# Patient Record
Sex: Female | Born: 1945 | Race: White | Hispanic: No | Marital: Married | Smoking: Never smoker
Health system: Southern US, Community
[De-identification: ages and names within clinical notes are randomized; demographics above are authoritative.]

## PROBLEM LIST (undated history)

## (undated) DIAGNOSIS — E119 Type 2 diabetes mellitus without complications: Secondary | ICD-10-CM

## (undated) DIAGNOSIS — M19041 Primary osteoarthritis, right hand: Secondary | ICD-10-CM

## (undated) DIAGNOSIS — G20A1 Parkinson's disease without dyskinesia, without mention of fluctuations: Secondary | ICD-10-CM

## (undated) DIAGNOSIS — M19042 Primary osteoarthritis, left hand: Secondary | ICD-10-CM

## (undated) DIAGNOSIS — G20 Parkinson's disease: Secondary | ICD-10-CM

## (undated) DIAGNOSIS — R202 Paresthesia of skin: Secondary | ICD-10-CM

## (undated) DIAGNOSIS — K219 Gastro-esophageal reflux disease without esophagitis: Secondary | ICD-10-CM

## (undated) DIAGNOSIS — M26629 Arthralgia of temporomandibular joint, unspecified side: Secondary | ICD-10-CM

## (undated) DIAGNOSIS — E782 Mixed hyperlipidemia: Secondary | ICD-10-CM

## (undated) HISTORY — DX: Type 2 diabetes mellitus without complications: E11.9

## (undated) HISTORY — DX: Gastro-esophageal reflux disease without esophagitis: K21.9

## (undated) HISTORY — DX: Arthralgia of temporomandibular joint, unspecified side: M26.629

## (undated) HISTORY — DX: Primary osteoarthritis, left hand: M19.042

## (undated) HISTORY — DX: Parkinson's disease: G20

## (undated) HISTORY — DX: Parkinson's disease without dyskinesia, without mention of fluctuations: G20.A1

## (undated) HISTORY — DX: Primary osteoarthritis, right hand: M19.041

## (undated) HISTORY — DX: Mixed hyperlipidemia: E78.2

## (undated) HISTORY — DX: Paresthesia of skin: R20.2

---

## 2019-10-05 ENCOUNTER — Encounter: Payer: Self-pay | Admitting: Neurology

## 2019-10-09 NOTE — Progress Notes (Signed)
Assessment/Plan:   1.  Parkinson's disease, by history  -Patient has apparently had this diagnosis for 15 years now, without change in medication dosage.  When she was last seen by Dr. Johnell Comings, he questioned the diagnosis.  I also am questioning the patient's diagnosis although she is a little bradykinetic.  Did discuss with the patient that her symptoms certainly could be hid by the fact that she is on medication, but generally if someone has had Parkinson's for this long, we usually can see symptoms of it despite being on medication.  We discussed DaTscan.  We also discussed that I think it would be wise for her to come back and see me off of medication for 48 hours to see if we have an accurate diagnosis.  This is especially true given that she is on a dopamine agonist at a fairly high dose (max dose) in somebody who is 74 years old.  This medication often times is not tolerated at this age group, or will not be as she ages.  She and I discussed this in detail.  Ultimately, she wants to think about her options.  We will call her back in 2 weeks and see how she would like to proceed.  She had many questions and I answered those to the best of my ability.  She obviously is fairly concerned about questioning the diagnosis, given that she has had it for a long time, but also recognizes that she has not deteriorated in the 15 years that she has had the diagnosis.  Subjective:   Nicole Castillo was seen today in the movement disorders clinic for neurologic consultation at the request of Donata Duff, MD.  The consultation is for the evaluation of Parkinson's disease.  Patient previously has seen multiple neurologists.  She last saw Dr. Johnell Comings in February, 2018.  She reports that she has not been seen by any neurologist since.  I have reviewed all records made available to me.   Patient brought me records from her early years.  Records indicate that patient was diagnosed in 2007 with essential tremor by  Dr. Logan Bores.  She was on multiple medications including propranolol, primidone, gabapentin, topiramate, amantadine, clonazepam, trihexyphenidyl, Cogentin, mirtazapine.  She then saw Dr. Chandra Batch, who felt that the patient did have Parkinson's disease.  He diagnosed her with that in 2009.   She was placed on levodopa in that year.  She brings those records for my review.  He indicates that she tried the medication and initially reported no help with the tremor and so then he started her on Requip XL, 2 mg daily, which also did not seem to help tremor.  This was discontinued and she subsequently tried Stalevo, 75 mg.  She later on want to try Mirapex, 4.5 mg daily.  She then changed care to Dr. Adella Hare.  At that point, she was on carbidopa/levodopa 50/200, 3 times per day (6:30am/noon/6pm) and pramipexole 1.5 mg 3 times per day.  She is still on those same medications at the same dosages.     In 2018, when she saw Dr. Johnell Comings, he did question the accuracy of the diagnosis.  He wrote "I do not see PD, but patient is on meds.  However, has had for 10 years and would expect to see something.  Offered to her to go off meds for couple of days and see how she does."  Patient was apparently to consider that option.  She did not follow-up after  that.  Patient reports that she did not go back because Dr. Johnell Comings changed offices and it was just too far to drive.   Specific Symptoms:  Tremor: Yes.   but only when upset now.  She isn't sure about character of tremor in the past.  If gets tremor if gets upset, she will get bilateral UE with intention only (no rest) Family hx of similar:  No. Voice: no change Sleep:   Vivid Dreams:  No.  Acting out dreams:  No. Wet Pillows: No. Postural symptoms:  Yes.  , when first stands especially  Falls?  No. Bradykinesia symptoms: intermittent shuffling Loss of smell:  No. Loss of taste:  No. Urinary Incontinence:  No. Difficulty Swallowing:  No. Handwriting,  micrographia: No. Trouble with ADL's:  No.  Trouble buttoning clothing: No. Depression:  No. Memory changes:  No. Hallucinations:  No. (perhaps rare)  visual distortions: Yes.   N/V:  No. Lightheaded:  No.  Syncope: No. Diplopia:  No. Dyskinesia:  No. Prior exposure to reglan/antipsychotics: No.  PREVIOUS MEDICATIONS:  propranolol, primidone, gabapentin, topiramate, amantadine, clonazepam, trihexyphenidyl, Cogentin, mirtazapine; levodopa; Requip; Mirapex  ALLERGIES:  No Known Allergies  CURRENT MEDICATIONS:  Current Outpatient Medications  Medication Instructions  . carbidopa-levodopa (SINEMET CR) 50-200 MG tablet 1 tablet, Oral, 3 times daily  . Cholecalciferol (VITAMIN D3 PO) 1,000 mg, Oral, Daily  . glucose blood (ACCU-CHEK AVIVA PLUS) test strip 1 each, Other, As needed, Use as instructed   . Lancets MISC As needed  . metFORMIN (GLUCOPHAGE-XR) 500 mg, Oral, 2 times daily  . Multiple Vitamin (MULTIVITAMIN WITH MINERALS) TABS tablet 1 tablet, Oral, Daily  . omeprazole (PRILOSEC) 20 mg, Oral, Daily  . pramipexole (MIRAPEX) 1.5 mg, Oral, 3 times daily  . rosuvastatin (CRESTOR) 10 mg, Oral, Daily    Objective:   VITALS:   Vitals:   10/23/19 1331  BP: (!) 152/72  Pulse: 85  SpO2: 98%  Weight: 117 lb (53.1 kg)  Height: 5\' 3"  (1.6 m)    GEN:  The patient appears stated age and is in NAD. HEENT:  Normocephalic, atraumatic.  The mucous membranes are moist. The superficial temporal arteries are without ropiness or tenderness. CV:  RRR Lungs:  CTAB Neck/HEME:  There are no carotid bruits bilaterally.  Neurological examination:  Orientation: The patient is alert and oriented x3.  Cranial nerves: There is good facial symmetry. There is facial hypomimia.  Extraocular muscles are intact. The visual fields are full to confrontational testing. The speech is fluent and clear. Soft palate rises symmetrically and there is no tongue deviation. Hearing is intact to conversational  tone. Sensation: Sensation is intact to light and pinprick throughout (facial, trunk, extremities). Vibration is intact at the bilateral big toe. There is no extinction with double simultaneous stimulation. There is no sensory dermatomal level identified. Motor: Strength is 5/5 in the bilateral upper and lower extremities.   Shoulder shrug is equal and symmetric.  There is no pronator drift. Deep tendon reflexes: Deep tendon reflexes are 2/4 at the bilateral biceps, triceps, brachioradialis, patella and achilles. Plantar responses are downgoing bilaterally.  Movement examination: Tone: There is normal tone in the bilateral upper extremities.  The tone in the lower extremities is normal.  Abnormal movements: none even with distraction Coordination:  There is  decremation with RAM's, only with alternation of supination/pronation of the forearm on the L and toe taps on the L Gait and Station: The patient ambulates well in the hall.  Total time spent  on today's visit was 70 minutes, including both face-to-face time and nonface-to-face time.  Time included that spent on review of records (prior notes available to me/labs/imaging if pertinent), discussing treatment and goals, answering patient's questions and coordinating care.  Cc:  Donata Duff, MD

## 2019-10-16 ENCOUNTER — Encounter: Payer: Self-pay | Admitting: Neurology

## 2019-10-23 ENCOUNTER — Ambulatory Visit (INDEPENDENT_AMBULATORY_CARE_PROVIDER_SITE_OTHER): Payer: MEDICARE | Admitting: Neurology

## 2019-10-23 ENCOUNTER — Other Ambulatory Visit: Payer: Self-pay

## 2019-10-23 ENCOUNTER — Encounter: Payer: Self-pay | Admitting: Neurology

## 2019-10-23 VITALS — BP 152/72 | HR 85 | Ht 63.0 in | Wt 117.0 lb

## 2019-10-23 DIAGNOSIS — G20 Parkinson's disease: Secondary | ICD-10-CM | POA: Diagnosis not present

## 2019-10-23 NOTE — Patient Instructions (Signed)
It was good to see you today.  We discussed that either your medications are doing a good job in covering up the symptoms of your Parkinsons Disease or it is a possibility that you don't have Parkinsons Disease.  I would like to do a DaT scan.  Please review the literature on the scan and we will call you in a few weeks to discuss further if you want to proceed.  The physicians and staff at [facility] are committed to providing excellent care. You may receive a survey requesting feedback about your experience at our office. We strive to receive "very good" responses to the survey questions. If you feel that your experience would prevent you from giving the office a "very good " response, please contact our office to try to remedy the situation. We may be reached at [phone]. Thank you for taking the time out of your busy day to complete the survey.

## 2019-10-26 ENCOUNTER — Telehealth: Payer: Self-pay

## 2019-10-26 NOTE — Telephone Encounter (Signed)
Spoke with patients daughter and she wanted to know know what kind of test Dr Tat wanted the patient to do.   Explained to daughter that Dr Tat wants her mom to consider having the DaTscan. She wanted to know if the patient would be able to have one visitor go back with her and I told her I thought so but ask the scheduler when they call to schedule appt. She voiced understanding and thanked me for returning her call.

## 2019-11-09 ENCOUNTER — Telehealth: Payer: Self-pay | Admitting: Neurology

## 2019-11-09 NOTE — Telephone Encounter (Signed)
Spoke with Dr Tat and she stated the patient will be given a medication to protect her thyroid. However all instructions will be given right before the test so the patient will know what to expect.   Patient notified and voiced understanding.   Patient wanted to know what times they do the testing. I advised her that once the scheduler contacts her she will be able to answer her scheduling questions.

## 2019-11-09 NOTE — Telephone Encounter (Signed)
Patient contacted the office and stated she has two questions about the DaTscan.   1) wants to know what kind of medication she will be given before her   Advised patient that I would speak with Dr Tat and get back to her. She voiced understanding.

## 2019-11-09 NOTE — Telephone Encounter (Signed)
Left message for patient to contact office.   Called and spoke with daughter Clydie Braun, per [facility] and she states the patient is considering the scan and is waiting for someone from our office to contact her. I advised Clydie Braun that I contacted the patient and left her a message. She states the patients other daughter is at the beach and will return Monday, and for me to expect a callback then.

## 2019-11-20 ENCOUNTER — Telehealth: Payer: Self-pay | Admitting: Neurology

## 2019-11-20 DIAGNOSIS — R251 Tremor, unspecified: Secondary | ICD-10-CM

## 2019-11-20 NOTE — Telephone Encounter (Signed)
Patient called to check on the status of the Dat scan. She still hasn't heard from anyone about scheduling.

## 2019-12-10 ENCOUNTER — Telehealth: Payer: Self-pay

## 2019-12-10 NOTE — Telephone Encounter (Signed)
Patient contacted the office today requesting that Ladona Ridgel from [facility] give her a call back today before noon in regards to her DaTscan.   Message sent to [facility] to please contact patient.   Ladona Ridgel voiced understanding.

## 2019-12-13 ENCOUNTER — Ambulatory Visit (HOSPITAL_COMMUNITY): Payer: Medicare Other

## 2020-01-10 ENCOUNTER — Ambulatory Visit (HOSPITAL_COMMUNITY)
Admission: RE | Admit: 2020-01-10 | Discharge: 2020-01-10 | Disposition: A | Discharge status: Home / Self Care | Payer: MEDICARE | Source: Ambulatory Visit | Attending: Neurology | Admitting: Neurology

## 2020-01-10 ENCOUNTER — Other Ambulatory Visit: Payer: Self-pay

## 2020-01-10 DIAGNOSIS — R251 Tremor, unspecified: Secondary | ICD-10-CM | POA: Insufficient documentation

## 2020-01-10 MED ORDER — IOFLUPANE I 123 185 MBQ/2.5ML IV SOLN
4.9000 | Freq: Once | INTRAVENOUS | Status: AC
  Administered 2020-01-10: 4.9 via INTRAVENOUS

## 2020-01-10 MED ORDER — POTASSIUM IODIDE (ANTIDOTE) 130 MG PO TABS: 130.0000 mg | ORAL_TABLET | Freq: Once | ORAL | Status: AC

## 2020-01-10 MED ORDER — POTASSIUM IODIDE (ANTIDOTE) 130 MG PO TABS
ORAL_TABLET | ORAL | Status: AC
  Administered 2020-01-10: 130 mg via ORAL
  Filled 2020-01-10: qty 1

## 2020-01-11 ENCOUNTER — Telehealth: Payer: Self-pay | Admitting: Neurology

## 2020-01-11 NOTE — Telephone Encounter (Signed)
Left message for patient to contact the office.

## 2020-01-11 NOTE — Telephone Encounter (Signed)
1.  Let pt know that her DaT scan was equivocal (meaning didn't give any definitive answers) 2.  No changes in med for now 3.  Have her make regular follow up appt (not urgent) 4.  When she follows up, I want her to hold her Parkinsons Disease meds for 24 hours (both the pramipexole And levodopa) prior to the appt

## 2020-01-11 NOTE — Telephone Encounter (Signed)
Spoke with patient and gave her Dr Don Perking recommendations. Informed her that one of the schedulers will give her a call on Monday to schedule an appt with Dr Tat.  She voiced understanding.   Patient requested a call Monday afternoon.   Message sent to schedulers.

## 2020-01-11 NOTE — Telephone Encounter (Signed)
Patient returned call to Tee. 

## 2020-01-11 NOTE — Telephone Encounter (Signed)
Patient left message that she was returning a phone call to the office regarding some results. Requesting call back.

## 2020-02-25 ENCOUNTER — Telehealth: Payer: Self-pay | Admitting: Neurology

## 2020-02-25 NOTE — Telephone Encounter (Signed)
Patient notified and voiced understanding.

## 2020-02-25 NOTE — Progress Notes (Signed)
Assessment/Plan:   1.  Parkinsons Disease  -Patient seen off of medication today and looked significantly more parkinsonian.  -We discussed her medication.  Discussed with her that I generally do not use carbidopa/levodopa 50/200 during the daytime.  However, she really has been happy with efficacy and would like to stay on that medication.  I did not change that today.  She will remain on carbidopa/levodopa 50/200, 1 tablet 3 times per day.  -Long discussion regarding the patient's pramipexole.  Discussed my concern with this medication, especially that she is on the max dose of the medicine and is having hallucinations and delusions.  Discussed with her that I really think we should decrease the dose of the medication.  She was leery, but agreeable.  I am going to slowly decrease the dosage from 1.5 mg 3 times per day to 1.0 mg 3 times per day.  This is still quite a large dosage in this age group.  Nonetheless, the patient is worried about even making this change.  We will see how she does clinically.  -I think that the patient needs physical therapy.  She declined that.  She will let me know if she changes her mind.  I encouraged her to get on her recumbent bike at home.  2.  Patient asked if she needed to follow-up here at all.  I told her I really think she needs to be followed by a neurologist, whether here or elsewhere.  She states that this is the closest place, and it is an hour away from her home.  Offered her video visits, but she has no capability to do so.  In that case, I think it is important that she at least follows here twice per year.  She was agreeable to that, as was her daughter. Subjective:   Nicole Castillo was seen today in follow up for Parkinsons disease.  My previous records were reviewed prior to todays visit as well as outside records available to me.  pts daughter present who supplements the history.   The patient had long been followed by another neurologist, last by  Dr. Johnell Comings, who questioned the diagnosis of Parkinson's disease, as she had the diagnosis for 15 years without significant progression.  When I saw her, I also question the diagnosis, although she was a little bit bradykinetic.  However, I recognize that I did see her on medication, which could have changed how she looked.  I did recommend that she come back and let me see her off medication, and called her a few days ago and asked her to hold her medication for today's visit (she has been off of it for over 24 hours).  She does state that she has not felt good since being off of the meds.  States that her legs/feet feel like lead and "my heart feels different."  Her daughter states that she is having some hallucinations - kids in the living room (pt denies that) but admits to illusions (leaf looking like a bug).  No falls since last visit.  Not doing any exercise.  More trouble getting in and out of the car (even prior to going off of the med)   Ultimately, she decided to go ahead and proceed with DaTscan. Patient had DaTscan done since last visit.  There was motion degrading of the imaging.  There was symmetric decreased uptake of the radiotracer bilaterally, considered equivocal for parkinsonian syndromes.  Pt denies falls.  Pt denies lightheadedness, near  syncope.  No hallucinations.  Mood has been good.  Current prescribed movement disorder medications: Carbidopa/levodopa 50/200, 1 tablet at 6:30 AM/noon/6 PM  Pramipexole 1.5 mg 3 times per day   PREVIOUS MEDICATIONS:  propranolol, primidone, gabapentin, topiramate, amantadine, clonazepam, trihexyphenidyl, Cogentin, mirtazapine; levodopa; Requip; Mirapex  ALLERGIES:  No Known Allergies  CURRENT MEDICATIONS:  Outpatient Encounter Medications as of 02/27/2020  Medication Sig  . carbidopa-levodopa (SINEMET CR) 50-200 MG tablet Take 1 tablet by mouth in the morning, at noon, and at bedtime.  . Cholecalciferol (VITAMIN D3 PO) Take 1,000 mg by mouth  daily.  Marland Kitchen glucose blood (ACCU-CHEK AVIVA PLUS) test strip 1 each by Other route as needed for other. Use as instructed  . Lancets MISC as needed.  . metFORMIN (GLUCOPHAGE-XR) 500 MG 24 hr tablet Take 500 mg by mouth in the morning and at bedtime.  . Multiple Vitamin (MULTIVITAMIN WITH MINERALS) TABS tablet Take 1 tablet by mouth daily.  Marland Kitchen omeprazole (PRILOSEC) 20 MG capsule Take 20 mg by mouth daily.  . pramipexole (MIRAPEX) 1.5 MG tablet Take 1.5 mg by mouth 3 (three) times daily.  . rosuvastatin (CRESTOR) 10 MG tablet Take 10 mg by mouth daily.   No facility-administered encounter medications on file as of 02/27/2020.    Objective:   PHYSICAL EXAMINATION:    VITALS:   Vitals:   02/27/20 1058  BP: (!) 176/71  Pulse: 94  SpO2: 99%  Weight: 124 lb (56.2 kg)  Height: 5\' 3"  (1.6 m)    GEN:  The patient appears stated age and is in NAD. HEENT:  Normocephalic, atraumatic.  The mucous membranes are moist. The superficial temporal arteries are without ropiness or tenderness. CV:  RRR Lungs:  CTAB Neck/HEME:  There are no carotid bruits bilaterally.  Neurological examination:  Orientation: The patient is alert and oriented x3. Cranial nerves: There is good facial symmetry with facial hypomimia. The speech is fluent and clear. Soft palate rises symmetrically and there is no tongue deviation. Hearing is intact to conversational tone. Sensation: Sensation is intact to light touch throughout Motor: Strength is at least antigravity x4.  Movement examination: Tone: There is mild increased tone in the right upper and lower extremity Abnormal movements: None Coordination:  There is mild decremation with RAM's, with hand opening and closing and finger taps bilaterally, right greater than left Gait and Station: The patient has difficulty arising out of a deep-seated chair without the use of the hands and requires mild assistance out of the wheelchair.  She is short stepped and holds onto the  examiner's hand.    Total time spent on today's visit was 30 minutes, including both face-to-face time and nonface-to-face time.  Time included that spent on review of records (prior notes available to me/labs/imaging if pertinent), discussing treatment and goals, answering patient's questions and coordinating care.  Cc:  , MD

## 2020-02-25 NOTE — Telephone Encounter (Signed)
Please call the patient and ask her to hold her carbidopa/levodopa and her pramipexole after her last dose tonight (so don't take any tomorrow) in anticipation of her visit on Wednesday.  I want to see what she looks like off of medication

## 2020-02-27 ENCOUNTER — Encounter: Payer: Self-pay | Admitting: Neurology

## 2020-02-27 ENCOUNTER — Other Ambulatory Visit: Payer: Self-pay

## 2020-02-27 ENCOUNTER — Ambulatory Visit (INDEPENDENT_AMBULATORY_CARE_PROVIDER_SITE_OTHER): Payer: MEDICARE | Admitting: Neurology

## 2020-02-27 VITALS — BP 176/71 | HR 94 | Ht 63.0 in | Wt 124.0 lb

## 2020-02-27 DIAGNOSIS — G20 Parkinson's disease: Secondary | ICD-10-CM

## 2020-02-27 MED ORDER — PRAMIPEXOLE DIHYDROCHLORIDE 1 MG PO TABS: 1.0000 mg | ORAL_TABLET | Freq: Three times a day (TID) | ORAL | 1 refills | Status: DC

## 2020-02-27 NOTE — Patient Instructions (Signed)
1.  Continue carbidopa/levodopa 50/200 three times per day 2.  Decrease pramipexole as follows: Week 1:  Take pramipexole, 1.5 mg in the AM, 1.5 mg in the afternoon and 1.0 mg in the evening  Week 2: Take pramipexole, 1.5 mg in the AM, 1.0 mg in the afternoon and 1.0 mg in the evening  Week 3 and beyond: Take pramipexole, 1.0 mg three times per day (can take it the same time as you take carbidopa/levodopa 50/200)

## 2020-04-23 ENCOUNTER — Telehealth: Payer: Self-pay | Admitting: Neurology

## 2020-04-23 NOTE — Telephone Encounter (Signed)
Per my last note: I think that the patient needs physical therapy.  She declined that.  She will let me know if she changes her mind.    Please encourage PT (home PT is fine).

## 2020-04-23 NOTE — Telephone Encounter (Signed)
Patient notified and voiced understanding.   Patient states she will think about it and give the office a call back.

## 2020-04-23 NOTE — Telephone Encounter (Signed)
Patient would like to speak to someone about her being weak. She states that she is having to hold on to something or someone her legs are so weak. There are times that she has to crawl to the bathroom because she is so weak   Please call

## 2020-04-24 ENCOUNTER — Telehealth: Payer: Self-pay | Admitting: Neurology

## 2020-04-24 DIAGNOSIS — G20 Parkinson's disease: Secondary | ICD-10-CM

## 2020-04-24 DIAGNOSIS — R251 Tremor, unspecified: Secondary | ICD-10-CM

## 2020-04-24 NOTE — Telephone Encounter (Signed)
As per yesterday, home PT is fine.  [facility] is good

## 2020-04-24 NOTE — Telephone Encounter (Signed)
Patient states she is interested in PT. Where would yu like to send her?

## 2020-04-25 NOTE — Telephone Encounter (Signed)
Referral created and sent to [facility]. Called patient and informed her that referral was sent today and to give a few days for [facility] to contact her.

## 2020-05-02 ENCOUNTER — Telehealth: Payer: Self-pay | Admitting: Neurology

## 2020-05-02 DIAGNOSIS — G20 Parkinson's disease: Secondary | ICD-10-CM

## 2020-05-02 NOTE — Telephone Encounter (Signed)
Try [facility]

## 2020-05-02 NOTE — Telephone Encounter (Signed)
Patient states that she wanted to check the status of the referral to PT because she has not heard anything from anyone. Please call patient

## 2020-05-02 NOTE — Telephone Encounter (Signed)
They cant accept her because she lives so far away they do not have any therapist that far.

## 2020-05-02 NOTE — Telephone Encounter (Signed)
I don't care who we use.

## 2020-05-05 NOTE — Telephone Encounter (Signed)
Found place to send patient for therapy. .  Order placed.

## 2020-05-09 ENCOUNTER — Telehealth: Payer: Self-pay | Admitting: Neurology

## 2020-05-09 NOTE — Telephone Encounter (Signed)
Patient called to check on the status of her referral for PT. See closed encounter from 04/24/20. Patient has not been informed.

## 2020-05-09 NOTE — Telephone Encounter (Signed)
Please call patient on Monday. 

## 2020-05-12 NOTE — Telephone Encounter (Signed)
Contacted [facility] to make sure they received my referral. I was informed that they did receive it but needed more information from our office. Advised her to fax over the form and we would complete it and fax it back. Fax number given.   Spoke with patient and informed her that we sent the referral and it was received however [facility] has a form that they want Dr Tat to sign. Informed her that Dr Tat is out of the office and once the form is signed I will fax it back. Advised her that she should hear back from the rehab. She voiced understanding.

## 2020-05-16 ENCOUNTER — Telehealth: Payer: Self-pay | Admitting: Neurology

## 2020-05-16 NOTE — Telephone Encounter (Signed)
Patient called in stating she was supposed to hear from someone about physical therapy this week, but has not heard back from anyone. Can someone take a look and why they have not contacted the patient?

## 2020-05-16 NOTE — Telephone Encounter (Signed)
Patient notified that I have called around to multiple places trying to find her home PT. Advised her that I found a place but she will have to go there they can not come to her house. She stated she only wanted home PT. I explained to patient that I have tried to find her therapy but have been very unsuccessful due to her location or companies having low staff therapist. She voiced understanding and stated she would wait for the therapy office to call her next week.

## 2020-06-05 ENCOUNTER — Telehealth: Payer: Self-pay | Admitting: Neurology

## 2020-06-05 NOTE — Telephone Encounter (Signed)
Patient's daughter called in stating the patient is having issues moving her legs and they feel like they are "in concrete" when she wakes up.

## 2020-06-05 NOTE — Telephone Encounter (Signed)
You can offer her an appointment on Monday at 9:45 (I think that is where the open slot is) to discuss sx's

## 2020-06-06 NOTE — Telephone Encounter (Signed)
LMOM for patient to call back to make the appt

## 2020-06-06 NOTE — Progress Notes (Signed)
Assessment/Plan:   1.  Parkinsons Disease  -Stop daytime use of carbidopa/levodopa 50/200  -Start carbidopa/levodopa 25/100, 2 tablets at 7 AM/2 tablets at 11 AM/2 tablets at 4 PM  -Take carbidopa/levodopa 50/200 at bedtime  -continue pramipexole, 1.0 mg tid for now.  Hallucinations resolved with the decreased dosage.  Likely will need to continue to decrease in the future  -DaTscan completed in 2021 was equivocal.  Reported that there was symmetric decreased uptake of the radiotracer bilaterally.  There was motion degraded images.  However, pt with clear clinical disease now  -will see her at her f/u appt in June.  After that she prefers to be seen prn    Subjective:   Nicole Castillo was seen today in follow up for Parkinsons disease.  My previous records were reviewed prior to todays visit as well as outside records available to me.  Patient is with her daughter who supplements the history.  Pt with 2 falls - with one she fell between the bed and wall.  With one she fell at grandsons house (not sure if tripped over rug or just fell - didn't get hurt).  Pt denies lightheadedness, near syncope.  Patient's daughter called me at the end of last week to state that the patient was having more trouble moving in the early morning and the patient felt like her legs were "in concrete."  In regards to hallucinations, she states that she is no longer having those  Current prescribed movement disorder medications: Carbidopa/levodopa 50/200, 1 tablet at 6:30 AM/noon/6 PM  Pramipexole 1.0 mg 3 times per day (decreased last visit from 1.5 mg 3 times per day because of hallucinations/delusions)   PREVIOUS MEDICATIONS:propranolol, primidone, gabapentin, topiramate, amantadine, clonazepam, trihexyphenidyl, Cogentin, mirtazapine; levodopa; Requip; Mirapex  ALLERGIES:  No Known Allergies  CURRENT MEDICATIONS:  Outpatient Encounter Medications as of 06/09/2020  Medication Sig  . carbidopa-levodopa  (SINEMET CR) 50-200 MG tablet Take 1 tablet by mouth in the morning, at noon, and at bedtime.  . Cholecalciferol (VITAMIN D3 PO) Take 1,000 mg by mouth daily.  Marland Kitchen glucose blood (ACCU-CHEK AVIVA PLUS) test strip 1 each by Other route as needed for other. Use as instructed  . Lancets MISC as needed.  . metFORMIN (GLUCOPHAGE-XR) 500 MG 24 hr tablet Take 500 mg by mouth in the morning and at bedtime.  . Multiple Vitamin (MULTIVITAMIN WITH MINERALS) TABS tablet Take 1 tablet by mouth daily.  Marland Kitchen omeprazole (PRILOSEC) 20 MG capsule Take 20 mg by mouth daily.  . pramipexole (MIRAPEX) 1 MG tablet Take 1 tablet (1 mg total) by mouth 3 (three) times daily.  . rosuvastatin (CRESTOR) 10 MG tablet Take 10 mg by mouth daily.   No facility-administered encounter medications on file as of 06/09/2020.    Objective:   PHYSICAL EXAMINATION:    VITALS:   Vitals:   06/09/20 0933  BP: 116/62  Pulse: 87  SpO2: 99%  Weight: 125 lb (56.7 kg)  Height: 5\' 3"  (1.6 m)    GEN:  The patient appears stated age and is in NAD. HEENT:  Normocephalic, atraumatic.  The mucous membranes are moist. The superficial temporal arteries are without ropiness or tenderness. CV:  RRR Lungs:  CTAB Neck/HEME:  There are no carotid bruits bilaterally.  Neurological examination:  Orientation: The patient is alert and oriented x3. Cranial nerves: There is good facial symmetry with facial hypomimia. The speech is fluent and clear. Soft palate rises symmetrically and there is no tongue deviation. Hearing  is intact to conversational tone. Sensation: Sensation is intact to light touch throughout Motor: Strength is at least antigravity x4.  Movement examination: Tone: There is mild increased tone in the LUE Abnormal movements: none Coordination:  There is  decremation with RAM's, with any form of RAMS, including alternating supination and pronation of the forearm, hand opening and closing, finger taps, heel taps and toe taps. Gait  and Station: The patient has difficulty arising out of a deep-seated chair without the use of the hands. The patient's stride length is decreased and she shuffles.      Total time spent on today's visit was 30 minutes, including both face-to-face time and nonface-to-face time.  Time included that spent on review of records (prior notes available to me/labs/imaging if pertinent), discussing treatment and goals, answering patient's questions and coordinating care.  Cc:  Donata Duff, MD

## 2020-06-09 ENCOUNTER — Encounter: Payer: Self-pay | Admitting: Neurology

## 2020-06-09 ENCOUNTER — Ambulatory Visit (INDEPENDENT_AMBULATORY_CARE_PROVIDER_SITE_OTHER): Payer: MEDICARE | Admitting: Neurology

## 2020-06-09 ENCOUNTER — Other Ambulatory Visit: Payer: Self-pay

## 2020-06-09 VITALS — BP 116/62 | HR 87 | Ht 63.0 in | Wt 125.0 lb

## 2020-06-09 DIAGNOSIS — G20 Parkinson's disease: Secondary | ICD-10-CM

## 2020-06-09 MED ORDER — CARBIDOPA-LEVODOPA ER 50-200 MG PO TBCR: 1.0000 | EXTENDED_RELEASE_TABLET | Freq: Every day | ORAL | 1 refills | Status: AC

## 2020-06-09 MED ORDER — CARBIDOPA-LEVODOPA 25-100 MG PO TABS: ORAL_TABLET | ORAL | 1 refills | Status: DC

## 2020-06-09 NOTE — Patient Instructions (Signed)
1.  Stop daytime use of carbidopa/levodopa 50/200 2.  Start carbidopa/levodopa 25/100, 2 tablets at 7 AM/2 tablets at 11 AM/2 tablets at 4 PM 3.  Take carbidopa/levodopa 50/200 at bedtime ONLY 4.  For now, continue your pramipexole, 1.0 mg three times per day.  We may reduce that next time.  The physicians and staff at [facility] are committed to providing excellent care. You may receive a survey requesting feedback about your experience at our office. We strive to receive "very good" responses to the survey questions. If you feel that your experience would prevent you from giving the office a "very good " response, please contact our office to try to remedy the situation. We may be reached at [phone]. Thank you for taking the time out of your busy day to complete the survey.

## 2020-06-11 ENCOUNTER — Telehealth: Payer: Self-pay | Admitting: Neurology

## 2020-06-11 MED ORDER — PRAMIPEXOLE DIHYDROCHLORIDE 0.5 MG PO TABS: 0.5000 mg | ORAL_TABLET | Freq: Three times a day (TID) | ORAL | Status: AC

## 2020-06-11 NOTE — Telephone Encounter (Signed)
Decrease the pramipexole to 0.5 mg tid.  Okay to call in RX for that dosage or they can split the 1.0 mg in half.  Tell daughter to call me in a week and let me know how doing.

## 2020-06-11 NOTE — Telephone Encounter (Signed)
Daughter notified and voice understanding. She states she will look at the medication and call back if she needs the rx to be sent.

## 2020-06-11 NOTE — Telephone Encounter (Signed)
Patient daughter states that the patient is doing good on the medication change but she is having hallucination again  Please call

## 2020-06-16 ENCOUNTER — Telehealth: Payer: Self-pay | Admitting: Neurology

## 2020-06-16 NOTE — Telephone Encounter (Signed)
Per Patient Daughter Clydie Braun they do not have the ability to video visit

## 2020-06-16 NOTE — Telephone Encounter (Signed)
Do they have any ability to do a video visit since they live far away?

## 2020-06-16 NOTE — Telephone Encounter (Signed)
Spoke with patients daughter, Clydie Braun, and made her aware of Dr Don Perking medication recommendations. She voiced understanding.

## 2020-06-16 NOTE — Telephone Encounter (Signed)
Have her take carbidopa/levodopa 25/100, 2 at 7am, 2 at 11am, 2 at 3pm, 1 at 7pm.  She can continue the carbidopa/levodopa 50/200 at bed and the lower dose pramipexole.

## 2020-06-16 NOTE — Telephone Encounter (Signed)
Patient's daughter called in stating the patient's hallucinations are better, but the change in the carbidopa-levodopa is no longer working. The patient cannot get her legs to move in the morning. It seems to be back to the way it was before.

## 2020-06-23 ENCOUNTER — Telehealth: Payer: Self-pay | Admitting: Neurology

## 2020-06-23 NOTE — Telephone Encounter (Signed)
Attempted to call the patients daughter no answer and no voicemail set up. Will try again later.

## 2020-06-24 NOTE — Telephone Encounter (Signed)
Spoke with patients daughter who states the patient is complaining of her legs feeling numb today and  heavy like they're filled with concrete yesterday. She states the patient says that she wants to go back to the medication she was on before she had the scan on her legs. Daughter reports that the patient told her she had to crawl to the restroom yesterday because her legs were so heavy. Daughter wants to know if this is a new normal? Daughter states she is not sure if her mom is eat enough of is she is consuming too much sweets and this is the cause. Daughter wants to know if there is anything Dr Tat can do?   Daughter states she can be reached on her home phone or cell phone.  Home: [phone] Cell: [phone]

## 2020-06-24 NOTE — Telephone Encounter (Signed)
Continue meds as RX

## 2020-06-24 NOTE — Telephone Encounter (Signed)
Spoke with Patient daughter and she will call back to make appt once she gets to her mother house

## 2020-06-24 NOTE — Telephone Encounter (Signed)
Patients daughter Clydie Braun notified and voiced understanding.

## 2020-06-24 NOTE — Telephone Encounter (Signed)
Spoke with Clydie Braun patient Daughter and got the patient sch for 07-03-20 with Tat. Clydie Braun needs to speak to the the nurse she needs to know if the patient need to cont in the medication until she see Dr Tat. Please call

## 2020-06-24 NOTE — Telephone Encounter (Signed)
As per last phone call, she needs an appt with me.  I've changed meds a lot over the phone.  They declined VV.  Annabelle Harman, if you have spots next Thursday, you can use that.

## 2020-07-01 NOTE — Progress Notes (Signed)
Assessment/Plan:   1.  Parkinsons Disease  Pt was instructed as follows:  -Take carbidopa/levodopa 25/100, 2 tablets at 7am, 2 tablets at 10am, 2 at 1pm, 2 at 4pm, 1 at 7pm (this is an increase of 2 levodopa per day)  -Take carbidopa/levodopa 50/200 at bed  -For now, continue pramipexole, 0.5 mg  three times per day.  I may need to lower this in the future for hallucinations.  -Patient was clearly frustrated.  She asked what would happen if she stopped all of her medication.  Told the patient that she likely would not be able to move or get out of bed.  I do think physical therapy would help, and she did not disagree.  We could not find any home physical therapy to go to her house.  She does not think she would be able to leave the house to get physical therapy and does not have physical therapy right near her house.  -We did discuss getting a trapeze for over her bed.   Subjective:   Nicole Castillo was seen today in follow up for Parkinsons disease.  My previous records were reviewed prior to todays visit as well as outside records available to me.  Patient is with her daughter who supplements the history.  We have been working on attempting to balance the patient's movements with hallucinations.  When we decreased her pramipexole, hallucinations got better, but movement (ability to do so) got worse.  Last visit, I went ahead and started increasing her levodopa because of that.  They called me back and stated that she was able to move again, but she was having hallucinations again.  Therefore, I dropped the pramipexole some more, compensating with an increase in levodopa.  Once again, the hallucinations got better and she started having trouble moving again.  They wanted to increase the pramipexole again, but I really did not want to do that given hallucinations.  She follows up today in that regard.  Current prescribed movement disorder medications: Carbidopa/levodopa 25/100, 2 tablets at 7  AM, 2 tablets at 11 AM, 2 tablets at 3 PM, 1 tablet at 7 PM (previous on carbidopa/levodopa 50/200 3 times daily) Carbidopa/levodopa 50/200 at bedtime Pramipexole 0.5 mg 3 times per day   PREVIOUS MEDICATIONS:propranolol, primidone, gabapentin, topiramate, amantadine, clonazepam, trihexyphenidyl, Cogentin, mirtazapine; levodopa; Requip; Mirapex  ALLERGIES:  No Known Allergies  CURRENT MEDICATIONS:  Outpatient Encounter Medications as of 07/03/2020  Medication Sig  . carbidopa-levodopa (SINEMET CR) 50-200 MG tablet Take 1 tablet by mouth at bedtime.  . carbidopa-levodopa (SINEMET IR) 25-100 MG tablet 2 tablets at 7 AM/2 tablets at 11 AM/2 tablets at 4 PM (Patient taking differently: 2 tablets at 7 AM/2 tablets at 11 AM/ 2 tablets  at 3 PM 1 at 7pm)  . Cholecalciferol (VITAMIN D3 PO) Take 1,000 mg by mouth daily.  Marland Kitchen glucose blood (ACCU-CHEK AVIVA PLUS) test strip 1 each by Other route as needed for other. Use as instructed  . Lancets MISC as needed.  . metFORMIN (GLUCOPHAGE-XR) 500 MG 24 hr tablet Take 500 mg by mouth in the morning and at bedtime.  . Multiple Vitamin (MULTIVITAMIN WITH MINERALS) TABS tablet Take 1 tablet by mouth daily.  Marland Kitchen omeprazole (PRILOSEC) 20 MG capsule Take 20 mg by mouth daily.  . pramipexole (MIRAPEX) 0.5 MG tablet Take 1 tablet (0.5 mg total) by mouth 3 (three) times daily.  . rosuvastatin (CRESTOR) 10 MG tablet Take 10 mg by mouth daily.   No  facility-administered encounter medications on file as of 07/03/2020.    Objective:   PHYSICAL EXAMINATION:    VITALS:   Vitals:   07/03/20 1443  BP: 130/66  Pulse: 84  SpO2: 97%  Weight: 123 lb 9.6 oz (56.1 kg)  Height: 5\' 3"  (1.6 m)    GEN:  The patient appears stated age and is in NAD. HEENT:  Normocephalic, atraumatic.  The mucous membranes are moist. The superficial temporal arteries are without ropiness or tenderness. CV:  RRR Lungs:  CTAB Neck/HEME:  There are no carotid bruits  bilaterally.  Neurological examination:  Orientation: The patient is alert and oriented x3. Cranial nerves: There is good facial symmetry with facial hypomimia. The speech is fluent and clear. Soft palate rises symmetrically and there is no tongue deviation. Hearing is intact to conversational tone. Sensation: Sensation is intact to light touch throughout Motor: Strength is at least antigravity x4.  Movement examination: Tone: There is mild increased tone in the LUE Abnormal movements: none Coordination:  There is  decremation with RAM's, with any form of RAMS, including alternating supination and pronation of the forearm, hand opening and closing, finger taps, heel taps and toe taps. Gait and Station: The patient has difficulty arising out of a deep-seated chair without the use of the hands. The patient's stride length is decreased and she shuffles.      Total time spent on today's visit was 33 minutes, including both face-to-face time and nonface-to-face time.  Time included that spent on review of records (prior notes available to me/labs/imaging if pertinent), discussing treatment and goals, answering patient's questions and coordinating care.  Cc:  , MD

## 2020-07-03 ENCOUNTER — Encounter: Payer: Self-pay | Admitting: Neurology

## 2020-07-03 ENCOUNTER — Ambulatory Visit (INDEPENDENT_AMBULATORY_CARE_PROVIDER_SITE_OTHER): Payer: MEDICARE | Admitting: Neurology

## 2020-07-03 ENCOUNTER — Other Ambulatory Visit: Payer: Self-pay

## 2020-07-03 VITALS — BP 130/66 | HR 84 | Ht 63.0 in | Wt 123.6 lb

## 2020-07-03 DIAGNOSIS — R441 Visual hallucinations: Secondary | ICD-10-CM | POA: Diagnosis not present

## 2020-07-03 DIAGNOSIS — G20 Parkinson's disease: Secondary | ICD-10-CM

## 2020-07-03 MED ORDER — CARBIDOPA-LEVODOPA 25-100 MG PO TABS: ORAL_TABLET | ORAL | 1 refills | Status: AC

## 2020-07-03 NOTE — Patient Instructions (Signed)
Take carbidopa/levodopa 25/100, 2 tablets at 7am, 2 tablets at 10am, 2 at 1pm, 2 at 4pm, 1 at 7pm (this is an increase of 2 levodopa per day) Take carbidopa/levodopa 50/200 at bed For now, continue pramipexole, 0.5 mg (check your dosage at home) three times per day.  I may need to lower this in the future for hallucinations.

## 2020-07-07 ENCOUNTER — Telehealth: Payer: Self-pay | Admitting: Neurology

## 2020-07-07 NOTE — Telephone Encounter (Signed)
Telephone call to pt daughter.   Pt bp last night 124/64 Heart rate 84 07/04/20.  Pt only took it due to feeling like her heart was racing.   Saturday pt had some hullanations. Nothing since then. Pt heart has calmed down Saturday. Per daughter pt c/o her legs feeling heavy.    Pt daughter wanted to know if could continue on this new dosing or will this subside after she gets use to taking it.      Pt doing okay right now after taking her morning dose, Daughter will call us back to see how the pt does on the mid morning and after dose

## 2020-07-07 NOTE — Telephone Encounter (Signed)
Patient's daughter called back in. She has some questions about her mother's medication

## 2020-07-07 NOTE — Telephone Encounter (Signed)
Continue med as is.  It wouldn't cause tachycardia so if she has palpitations, f/u PCP

## 2020-07-08 NOTE — Telephone Encounter (Signed)
Patient called in. She say she has been taking the Sinemet CR for 2 nights and the next morning she felt shaky,  her heart felt like it's beating slower and her feet were numb. She is not wanting to continue taking the Sinemet CR.

## 2020-07-08 NOTE — Telephone Encounter (Signed)
Have her take pramipexole, 0.5 mg, 1/2 in the AM and 1 tablet in the afternoon and evening for now.  May lower that further if continues

## 2020-07-08 NOTE — Telephone Encounter (Signed)
No.  carbidopa/levodopa 25/100 doesn't change HR as per previous but glad she has an appt with PCP.

## 2020-07-08 NOTE — Telephone Encounter (Signed)
Patient daughter advised the pt is to continue taking the medication as is.  To F/U with PCP if symptoms continue.   Per pt daughter the pt complain this morning of another round of hallinations this morning. She wanted to let Dr.Tat know. Unsure if that makes a difference.

## 2020-07-08 NOTE — Telephone Encounter (Signed)
Pt advised of to takepramipexole, 0.5 mg, 1/2 in the AM and 1 tablet in the afternoon and evening for now.  May lower that further if continues.  Pt states the Carbidopa levodopa 50-100 make her shaky, Heart palpations and her heart rate to slow down   Pt wanted to know if she could stop taking that for now. She has an appt schedule with her PCP in the morning.   Please advise.

## 2020-07-08 NOTE — Telephone Encounter (Signed)
Pt advised.

## 2020-07-08 NOTE — Telephone Encounter (Signed)
Patient's daughter called back in and left a message wanting a call back

## 2020-07-14 ENCOUNTER — Telehealth: Payer: Self-pay | Admitting: Neurology

## 2020-07-14 DIAGNOSIS — G20 Parkinson's disease: Secondary | ICD-10-CM

## 2020-07-14 NOTE — Telephone Encounter (Signed)
Spoke with patient who states the extended release carbidopa levodopa causes her legs to feel paralyze. She states the medication causes the patient to hardly be able to move or lift her legs. She wants to know if there is something else that she can take?   She also wanted to know if she could have a referral for physical therapy?

## 2020-07-14 NOTE — Telephone Encounter (Signed)
Looks like it says that she wakes up with legs feeling paralyzed.  That is likely because medicine has worn off and is no longer in her system, not as a medication reaction.  Yes to PT.

## 2020-07-14 NOTE — Telephone Encounter (Signed)
Attempted to call patient back but no answer. Will try again later.

## 2020-07-14 NOTE — Telephone Encounter (Signed)
F/u    Daughter calling asking the nurse to call back to discuss mom issues.

## 2020-07-14 NOTE — Telephone Encounter (Signed)
New message    Pt c/o medication issue:  1. Name of Medication: carbidopa-levodopa (SINEMET CR) 50-200 MG tablet  2. How are you currently taking this medication (dosage and times per day)? Taken at 10 pm at night  3. Are you having a reaction (difficulty breathing--STAT)? No   4. What is your medication issue? When she wake up legs are paralyzed.    5. Need a referral for physical therapy [facility] in [place].  Fax # [phone]

## 2020-07-14 NOTE — Telephone Encounter (Signed)
Spoke with patient who states she does not want to take the carbidopa levodopa at bedtime because she states the medication makes her feel weird. Gave patients Dr Don Perking recommendations. She states she will think about wanting to take the medication. She asked my opinion and I told her that I do not have an opinion all I can do is give her the instructions from Dr Tat and she will have to do what is best for her.   She then wanted to know was we going to send the referral for her to go to physical therapy.   Order has been placed.

## 2020-07-15 NOTE — Telephone Encounter (Signed)
Patient called and will decide if she will stop it. Told her to call with report in a few weeks.

## 2020-07-15 NOTE — Telephone Encounter (Signed)
Noted.  They can d/c if they want but I fear her sx's will just get worse.

## 2020-08-07 ENCOUNTER — Telehealth: Payer: Self-pay | Admitting: Neurology

## 2020-08-07 NOTE — Telephone Encounter (Signed)
Patient wants to know if she can take Tylenol for her pain. Please advise?

## 2020-08-07 NOTE — Telephone Encounter (Signed)
I have no objection to tylenol from a Parkinsons Disease standpoint but she should f/u with PCP as well.

## 2020-08-08 NOTE — Telephone Encounter (Signed)
Spoke to pt will call her PCP if the pain get worse. Reminded pt with appt with Dr. Arbutus Leas and verified medication Rx carbidopa-lepodoba instructions.

## 2020-08-27 ENCOUNTER — Ambulatory Visit: Payer: MEDICARE | Admitting: Neurology

## 2021-10-30 IMAGING — NM NM DATSCAN
2 series · 12 of 12 positions shown · non-contrast
Comparison: Brain MRI 08/18/2008

CLINICAL DATA: 74-year-old female.  Tremors.

EXAM:
NUCLEAR MEDICINE BRAIN IMAGING WITH SPECT  (DaTscan )
TECHNIQUE: SPECT images of the brain were obtained after intravenous injection
of radiopharmaceutical. 4 hour post injection imaging. Appropriate
positioning. 130 mg [place] given orally for thyroid blockade.
RADIOPHARMACEUTICALS:  4.9 millicuries I 123 Ioflupane

[Series 1: spect - (id) _(id)_tra · 4.1mm · 4.14mm/px · 6 of 128 frames shown]
[frame 11/128]
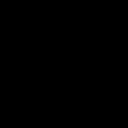
[frame 32/128]
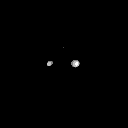
[frame 54/128]
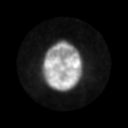
[frame 75/128]
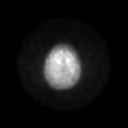
[frame 96/128]
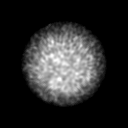
[frame 118/128]
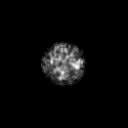

[Series 1: spect - (id) _(id)_cor · 4.1mm · 4.14mm/px · 6 of 128 frames shown]
[frame 11/128]
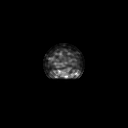
[frame 32/128]
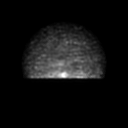
[frame 54/128]
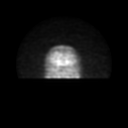
[frame 75/128]
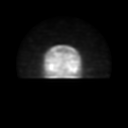
[frame 96/128]
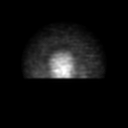
[frame 118/128]
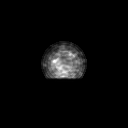

[12 of 12 positions shown; findings below may reference images not displayed]

FINDINGS: Motion degradation of the imaging. There is overall decreased
relative radiotracer activity within the LEFT and RIGHT basal
ganglia. There is mild symmetric truncation of the posterior
striatum on the LEFT and RIGHT. Relative decreased radiotracer
activity in the heads of caudate nuclei.
IMPRESSION: Symmetric decreased striatal Ioflupane activity as above. Pattern is
equivocal for parkinsonian syndromes.

Of note, DaTSCAN is not diagnostic of Parkinsonian syndromes, which
remains a clinical diagnosis. DaTscan is an adjuvant test to aid in
the clinical diagnosis of Parkinsonian syndromes.

## 2024-06-19 ENCOUNTER — Ambulatory Visit: Payer: Self-pay | Admitting: Neurology
